# Patient Record
Sex: Female | Born: 1955 | Race: Black or African American | Hispanic: No | Marital: Married | Smoking: Never smoker
Health system: Southern US, Community
[De-identification: ages and names within clinical notes are randomized; demographics above are authoritative.]

## PROBLEM LIST (undated history)

## (undated) DIAGNOSIS — I10 Essential (primary) hypertension: Secondary | ICD-10-CM

## (undated) DIAGNOSIS — G5600 Carpal tunnel syndrome, unspecified upper limb: Secondary | ICD-10-CM

## (undated) DIAGNOSIS — M1990 Unspecified osteoarthritis, unspecified site: Secondary | ICD-10-CM

## (undated) HISTORY — PX: CARPAL TUNNEL RELEASE: SHX101

## (undated) HISTORY — DX: Essential (primary) hypertension: I10

## (undated) HISTORY — DX: Unspecified osteoarthritis, unspecified site: M19.90

## (undated) HISTORY — PX: TOTAL SHOULDER ARTHROPLASTY: SHX126

## (undated) HISTORY — PX: ABDOMINAL HYSTERECTOMY: SHX81

## (undated) HISTORY — DX: Carpal tunnel syndrome, unspecified upper limb: G56.00

## (undated) HISTORY — PX: TRIGGER FINGER RELEASE: SHX641

---

## 2000-02-22 ENCOUNTER — Ambulatory Visit (HOSPITAL_BASED_OUTPATIENT_CLINIC_OR_DEPARTMENT_OTHER): Admission: RE | Admit: 2000-02-22 | Discharge: 2000-02-22 | Payer: Self-pay | Admitting: *Deleted

## 2014-01-10 ENCOUNTER — Other Ambulatory Visit: Payer: Self-pay | Admitting: Orthopedic Surgery

## 2014-01-10 DIAGNOSIS — M545 Low back pain, unspecified: Secondary | ICD-10-CM

## 2014-01-16 ENCOUNTER — Ambulatory Visit
Admission: RE | Admit: 2014-01-16 | Discharge: 2014-01-16 | Disposition: A | Discharge status: Home / Self Care | Payer: BC Managed Care – PPO | Source: Ambulatory Visit | Attending: Orthopedic Surgery | Admitting: Orthopedic Surgery

## 2014-01-16 DIAGNOSIS — M545 Low back pain, unspecified: Secondary | ICD-10-CM

## 2016-09-13 ENCOUNTER — Other Ambulatory Visit: Payer: Self-pay | Admitting: Orthopedic Surgery

## 2016-09-13 DIAGNOSIS — M5416 Radiculopathy, lumbar region: Secondary | ICD-10-CM

## 2016-09-24 ENCOUNTER — Inpatient Hospital Stay
Admission: RE | Admit: 2016-09-24 | Discharge: 2016-09-24 | Disposition: A | Discharge status: Home / Self Care | Payer: BLUE CROSS/BLUE SHIELD | Source: Ambulatory Visit | Attending: Orthopedic Surgery | Admitting: Orthopedic Surgery

## 2016-09-24 ENCOUNTER — Other Ambulatory Visit: Payer: Self-pay

## 2016-10-01 ENCOUNTER — Ambulatory Visit
Admission: RE | Admit: 2016-10-01 | Discharge: 2016-10-01 | Disposition: A | Discharge status: Home / Self Care | Payer: BLUE CROSS/BLUE SHIELD | Source: Ambulatory Visit | Attending: Orthopedic Surgery | Admitting: Orthopedic Surgery

## 2016-10-01 DIAGNOSIS — M5416 Radiculopathy, lumbar region: Secondary | ICD-10-CM

## 2016-10-01 MED ORDER — MEPERIDINE HCL 100 MG/ML IJ SOLN
75.0000 mg | Freq: Once | INTRAMUSCULAR | Status: AC
  Administered 2016-10-01: 75 mg via INTRAMUSCULAR

## 2016-10-01 MED ORDER — ONDANSETRON HCL 4 MG/2ML IJ SOLN
4.0000 mg | Freq: Once | INTRAMUSCULAR | Status: AC
  Administered 2016-10-01: 4 mg via INTRAMUSCULAR

## 2016-10-01 MED ORDER — DIAZEPAM 5 MG PO TABS
10.0000 mg | ORAL_TABLET | Freq: Once | ORAL | Status: AC
  Administered 2016-10-01: 10 mg via ORAL

## 2016-10-01 MED ORDER — IOPAMIDOL (ISOVUE-M 200) INJECTION 41%
15.0000 mL | Freq: Once | INTRAMUSCULAR | Status: AC
  Administered 2016-10-01: 15 mL via INTRATHECAL

## 2016-10-01 NOTE — Discharge Instructions (Signed)

## 2017-01-27 DIAGNOSIS — M19011 Primary osteoarthritis, right shoulder: Secondary | ICD-10-CM | POA: Insufficient documentation

## 2017-01-27 DIAGNOSIS — M5136 Other intervertebral disc degeneration, lumbar region: Secondary | ICD-10-CM | POA: Insufficient documentation

## 2017-01-27 DIAGNOSIS — M19012 Primary osteoarthritis, left shoulder: Secondary | ICD-10-CM

## 2017-01-27 DIAGNOSIS — M2550 Pain in unspecified joint: Secondary | ICD-10-CM | POA: Insufficient documentation

## 2017-01-30 ENCOUNTER — Ambulatory Visit (INDEPENDENT_AMBULATORY_CARE_PROVIDER_SITE_OTHER): Payer: Self-pay

## 2017-01-30 ENCOUNTER — Telehealth: Payer: Self-pay | Admitting: Rheumatology

## 2017-01-30 ENCOUNTER — Ambulatory Visit (INDEPENDENT_AMBULATORY_CARE_PROVIDER_SITE_OTHER): Payer: BLUE CROSS/BLUE SHIELD | Admitting: Rheumatology

## 2017-01-30 ENCOUNTER — Encounter: Payer: Self-pay | Admitting: Rheumatology

## 2017-01-30 VITALS — BP 147/85 | HR 91 | Resp 14 | Ht 66.0 in | Wt 220.0 lb

## 2017-01-30 DIAGNOSIS — M19011 Primary osteoarthritis, right shoulder: Secondary | ICD-10-CM | POA: Diagnosis not present

## 2017-01-30 DIAGNOSIS — M79642 Pain in left hand: Secondary | ICD-10-CM

## 2017-01-30 DIAGNOSIS — Z8719 Personal history of other diseases of the digestive system: Secondary | ICD-10-CM

## 2017-01-30 DIAGNOSIS — M25561 Pain in right knee: Secondary | ICD-10-CM | POA: Diagnosis not present

## 2017-01-30 DIAGNOSIS — Z8639 Personal history of other endocrine, nutritional and metabolic disease: Secondary | ICD-10-CM | POA: Insufficient documentation

## 2017-01-30 DIAGNOSIS — R5383 Other fatigue: Secondary | ICD-10-CM | POA: Diagnosis not present

## 2017-01-30 DIAGNOSIS — G8929 Other chronic pain: Secondary | ICD-10-CM

## 2017-01-30 DIAGNOSIS — M5136 Other intervertebral disc degeneration, lumbar region: Secondary | ICD-10-CM

## 2017-01-30 DIAGNOSIS — M19012 Primary osteoarthritis, left shoulder: Secondary | ICD-10-CM

## 2017-01-30 DIAGNOSIS — M79641 Pain in right hand: Secondary | ICD-10-CM

## 2017-01-30 DIAGNOSIS — M79672 Pain in left foot: Secondary | ICD-10-CM

## 2017-01-30 DIAGNOSIS — M25562 Pain in left knee: Secondary | ICD-10-CM

## 2017-01-30 DIAGNOSIS — E669 Obesity, unspecified: Secondary | ICD-10-CM

## 2017-01-30 DIAGNOSIS — Z8679 Personal history of other diseases of the circulatory system: Secondary | ICD-10-CM

## 2017-01-30 DIAGNOSIS — M79671 Pain in right foot: Secondary | ICD-10-CM | POA: Diagnosis not present

## 2017-01-30 LAB — CBC WITH DIFFERENTIAL/PLATELET
Basophils Absolute: 0 cells/uL (ref 0–200)
Basophils Relative: 0 %
EOS ABS: 101 {cells}/uL (ref 15–500)
Eosinophils Relative: 1 %
HEMATOCRIT: 40.5 % (ref 35.0–45.0)
Hemoglobin: 12.8 g/dL (ref 11.7–15.5)
Lymphocytes Relative: 32 %
Lymphs Abs: 3232 cells/uL (ref 850–3900)
MCH: 27.6 pg (ref 27.0–33.0)
MCHC: 31.6 g/dL — AB (ref 32.0–36.0)
MCV: 87.3 fL (ref 80.0–100.0)
MPV: 9.6 fL (ref 7.5–12.5)
Monocytes Absolute: 808 cells/uL (ref 200–950)
Monocytes Relative: 8 %
NEUTROS PCT: 59 %
Neutro Abs: 5959 cells/uL (ref 1500–7800)
Platelets: 361 10*3/uL (ref 140–400)
RBC: 4.64 MIL/uL (ref 3.80–5.10)
RDW: 14.3 % (ref 11.0–15.0)
WBC: 10.1 10*3/uL (ref 3.8–10.8)

## 2017-01-30 LAB — URIC ACID: Uric Acid, Serum: 4.1 mg/dL (ref 2.5–7.0)

## 2017-01-30 LAB — COMPLETE METABOLIC PANEL WITH GFR
ALT: 11 U/L (ref 6–29)
AST: 13 U/L (ref 10–35)
Albumin: 4.1 g/dL (ref 3.6–5.1)
Alkaline Phosphatase: 51 U/L (ref 33–130)
BUN: 9 mg/dL (ref 7–25)
CALCIUM: 9.7 mg/dL (ref 8.6–10.4)
CHLORIDE: 103 mmol/L (ref 98–110)
CO2: 28 mmol/L (ref 20–32)
Creat: 0.84 mg/dL (ref 0.50–0.99)
GFR, EST AFRICAN AMERICAN: 87 mL/min (ref >=60)
GFR, EST NON AFRICAN AMERICAN: 76 mL/min (ref >=60)
Glucose, Bld: 107 mg/dL — ABNORMAL HIGH (ref 65–99)
POTASSIUM: 4.1 mmol/L (ref 3.5–5.3)
Sodium: 142 mmol/L (ref 135–146)
Total Bilirubin: 0.4 mg/dL (ref 0.2–1.2)
Total Protein: 6.8 g/dL (ref 6.1–8.1)

## 2017-01-30 LAB — CK: CK TOTAL: 190 U/L — AB (ref 29–143)

## 2017-01-30 LAB — TSH: TSH: 1.41 mIU/L

## 2017-01-30 NOTE — Telephone Encounter (Signed)
Patient was seen today and has a question about the xrays that she was given. Please call patient.

## 2017-01-30 NOTE — Telephone Encounter (Signed)
Patient states she was given a paper with results on it when in the office. She wants to make sure that what she was seeing on her AVS was the x-rays she had preformed and the results. Patient advised that is correct.

## 2017-01-30 NOTE — Progress Notes (Signed)
Office Visit Note  Patient: Jamie Pena             Date of Birth: 12/30/1955           MRN: [id number]             PCP: Eliott Nine, MD Referring: Lunette Stands, MD Visit Date: 01/30/2017 Occupation: @GUAROCC @    Subjective:  Pain in multiple joints.   History of Present Illness: Raylan Troiani is a 61 y.o. female seen in consultation per request of Dr. Charlett Blake. According to patient she's had symptoms of joint pain since 2017. She states the pain started in her lower back and gradually moved into other joints. She has had extensive workup for her lumbar spine which showed multilevel disc disease. She has had injections by Dr. Maurice Small. She also has discomfort in her bilateral shoulders for multiple years. She states she had left rotator cuff tear surgery in the past. She's been also told that she is osteoarthritis in about shoulders. She states she has discomfort in her bilateral wrist and her bilateral hands. She also has discomfort in her bilateral knee joints multiple years. She states her ankles and feet swell at times.  She gives history of bilateral carpal tunnel release and also trigger finger release in the past.  Activities of Daily Living:  Patient reports morning stiffness for all day hours.   Patient Reports nocturnal pain.  Difficulty dressing/grooming: Denies Difficulty climbing stairs: Reports Difficulty getting out of chair: Reports Difficulty using hands for taps, buttons, cutlery, and/or writing: Denies   Review of Systems  Constitutional: Positive for fatigue. Negative for night sweats, weight gain, weight loss and weakness.  HENT: Negative for mouth sores, trouble swallowing, trouble swallowing, mouth dryness and nose dryness.   Eyes: Positive for dryness. Negative for pain, redness and visual disturbance.  Respiratory: Negative for cough, shortness of breath and difficulty breathing.   Cardiovascular: Positive for hypertension. Negative for chest pain,  palpitations, irregular heartbeat and swelling in legs/feet.  Gastrointestinal: Negative for blood in stool, constipation and diarrhea.  Endocrine: Negative for increased urination.  Genitourinary: Negative for vaginal dryness.  Musculoskeletal: Positive for arthralgias, joint pain, joint swelling and morning stiffness. Negative for myalgias, muscle weakness, muscle tenderness and myalgias.  Skin: Negative for color change, rash, hair loss, skin tightness, ulcers and sensitivity to sunlight.  Allergic/Immunologic: Negative for susceptible to infections.  Neurological: Negative for dizziness, memory loss and night sweats.  Hematological: Negative for swollen glands.  Psychiatric/Behavioral: Positive for depressed mood and sleep disturbance. The patient is nervous/anxious.     PMFS History:  Patient Active Problem List   Diagnosis Date Noted  . History of hypertension 01/30/2017  . History of hyperlipidemia 01/30/2017  . History of gastroesophageal reflux (GERD) 01/30/2017  . Multiple joint pain 01/27/2017  . Primary osteoarthritis of both shoulders 01/27/2017  . 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 Carpal tunnel syndrome   . Hypertension   . Osteoarthritis     Family History  Problem Relation Age of Onset  . Osteoporosis Mother   . High arches Mother    Past Surgical History:  Procedure Laterality Date  . ABDOMINAL HYSTERECTOMY    . CARPAL TUNNEL RELEASE    . TOTAL SHOULDER ARTHROPLASTY    . TRIGGER FINGER RELEASE     Social History   Social History Narrative  . No narrative on file  Objective: Vital Signs: BP (!) 147/85   Pulse 91   Resp 14   Ht 5\' 6"  (1.676 m)   Wt 220 lb (99.8 kg)   BMI 35.51 kg/m    Physical Exam  Constitutional: She is oriented to person, place, and time. She appears well-developed and well-nourished.  HENT:  Head: Normocephalic and atraumatic.    Eyes: Conjunctivae and EOM are normal.  Neck: Normal range of motion.  Cardiovascular: Normal rate, regular rhythm, normal heart sounds and intact distal pulses.   Pulmonary/Chest: Effort normal and breath sounds normal.  Abdominal: Soft. Bowel sounds are normal.  Lymphadenopathy:    She has no cervical adenopathy.  Neurological: She is alert and oriented to person, place, and time.  Skin: Skin is warm and dry. Capillary refill takes less than 2 seconds.  Psychiatric: She has a normal mood and affect. Her behavior is normal.  Nursing note and vitals reviewed.    Musculoskeletal Exam: C-spine good range of motion. She has painful range of motion of her lumbar spine which was limited. Her shoulder joint abduction was limited to 90 which was painful. Her elbow joints are good range of motion. Wrists joint MCPs PIPs DIPs with good range of motion she has tenderness across her PIPs of her hands. No synovitis was noted. Hip joints knee joints ankles MTPs PIPs with good range of motion. She appears to have some pedal edema in her lower extremities. She painful range of motion of her knee joints ankles and tenderness across MTPs but no synovitis was noted.  CDAI Exam: No CDAI exam completed.    Investigation: No additional findings.   Imaging: Xr Foot 2 Views Left  Result Date: 01/30/2017 PIP/DIP narrowing was noted. No MTP joint narrowing was noted. A  calcaneal spur was noted. Impression: Findings are consistent with osteoarthritis of the foot.  Xr Foot 2 Views Right  Result Date: 01/30/2017 PIP/DIP narrowing was noted. No MTP joint narrowing was noted. A  calcaneal spur was noted. Impression: Findings are consistent with osteoarthritis of the foot.  Xr Hand 2 View Left  Result Date: 01/30/2017 PIP/DIP narrowing was noted. No MCP joint narrowing was noted. No intercarpal joint space narrowing was noted. These findings are consistent with osteoarthritis of the hand  Xr Hand 2 View  Right  Result Date: 01/30/2017 PIP/DIP narrowing was noted. No MCP joint narrowing was noted. No intercarpal joint space narrowing was noted. These findings are consistent with osteoarthritis of the hand.  Xr Knee 3 View Left  Result Date: 01/30/2017 Moderate medial compartment narrowing and medial and intercondylar osteophytes were noted. No chondrocalcinosis were noted. Severe patellofemoral narrowing was noted. 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  Xr Knee 3 View Right  Result Date: 01/30/2017 Moderate medial compartment narrowing and medial and intercondylar osteophytes were noted. No chondrocalcinosis were noted. Severe patellofemoral narrowing was noted. 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  Xr Shoulder Left  Result Date: 01/30/2017 Glenohumeral joint space narrowing and spurring was noted. Acromioclavicular joint space narrowing was noted. Impression: These findings are consistent with osteoarthritis of the shoulder.  Xr Shoulder Right  Result Date: 01/30/2017 No glenohumeral joint space narrowing was noted. No acromioclavicular joint space narrowing was noted.   Speciality Comments: No specialty comments available.    Procedures:  No procedures performed Allergies: Sulfa antibiotics   Assessment / Plan:     Visit  Diagnoses: Pain in both hands -patient gives history of pain in her bilateral hands with intermittent swelling. I do not see any synovitis on examination today. I'll obtain following labs to evaluate this further. Plan: XR Hand 2 View Right, XR Hand 2 View Left, mild osteoarthritic changes were noted in bilateral hands. Sedimentation rate, ANA, Rheumatoid factor, Cyclic citrul peptide antibody, IgG, Angiotensin converting enzyme, Uric acid  Bilateral carpal tunnel release and bilateral trigger thumb  release according to patient.  Primary osteoarthritis of both shoulders. She gives history of chronic pain in her bilateral shoulders. She had left rotator cuff tear repair in the past. She has limited range of motion of bilateral shoulders. - Plan: XR Shoulder Right, XR Shoulder Left. Right shoulder joint x-ray was unremarkable. Left shoulder joint which revealed osteoarthritis.  Chronic pain of both knees. No warmth swelling or effusion was noted. - Plan: XR KNEE 3 VIEW RIGHT, XR KNEE 3 VIEW LEFT. Bilateral knee joints showed moderate osteoarthritis and severe chondromalacia patella no chondrocalcinosis.  Pain in both feet. She gives history of pain in bilateral ankles and feet with intermittent swelling her do not see any swelling or synovitis on examination today. She has some pedal edema. - Plan: XR Foot 2 Views Right, XR Foot 2 Views Left. Mild osteoarthritic changes were noted in bilateral feet.  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 Charlett BlakeVoytek and also has had injections in her lower back. She continues to have lower back pain.  Other fatigue - Plan: CBC with Differential/Platelet, COMPLETE METABOLIC PANEL WITH GFR, CK, TSH  History of hypertension: Her blood pressure is elevated at advised her to monitor blood pressure closely.  History of hyperlipidemia  History of gastroesophageal reflux (GERD)   Obesity (BMI 35.51) weight loss will be helpful for lower back pain and also knee joint discomfort.  Orders: Orders Placed This Encounter  Procedures  . XR Hand 2 View Right  . XR Hand 2 View Left  . XR Shoulder Right  . XR Shoulder Left  . XR KNEE 3 VIEW RIGHT  . XR KNEE 3 VIEW LEFT  . XR Foot 2 Views Right  . XR Foot 2 Views Left  . CBC with Differential/Platelet  . COMPLETE METABOLIC PANEL WITH GFR  . Sedimentation rate  . CK  . TSH  . ANA  . Rheumatoid factor  . Cyclic citrul peptide antibody, IgG  .  Angiotensin converting enzyme  . Uric acid   No orders of the defined types were placed in this encounter.   Face-to-face time spent with patient was 50 minutes. 50% of time was spent in counseling and coordination of care.  Follow-Up Instructions: Return for Osteoarthritis.   Pollyann SavoyShaili Quantel Mcinturff, MD  Note - This record has been created using [employer] software.  Chart creation errors have been sought, but may not always  have been located. Such creation errors do not reflect on  the standard of medical care.

## 2017-01-31 LAB — CYCLIC CITRUL PEPTIDE ANTIBODY, IGG: Cyclic Citrullin Peptide Ab: <16 Units

## 2017-01-31 LAB — SEDIMENTATION RATE: SED RATE: 1 mm/h (ref 0–30)

## 2017-01-31 LAB — RHEUMATOID FACTOR

## 2017-01-31 LAB — ANA: ANA: NEGATIVE

## 2017-01-31 LAB — ANGIOTENSIN CONVERTING ENZYME: Angiotensin-Converting Enzyme: 28 U/L (ref 9–67)

## 2017-02-01 NOTE — Progress Notes (Signed)
WNLs except CK mildly elevated. Will discuss at fu visit. May sch US of bilat hands if she is still having hand pain and intermittent swelling.

## 2017-03-03 DIAGNOSIS — M19042 Primary osteoarthritis, left hand: Principal | ICD-10-CM

## 2017-03-03 DIAGNOSIS — M170 Bilateral primary osteoarthritis of knee: Secondary | ICD-10-CM | POA: Insufficient documentation

## 2017-03-03 DIAGNOSIS — M19012 Primary osteoarthritis, left shoulder: Secondary | ICD-10-CM | POA: Insufficient documentation

## 2017-03-03 DIAGNOSIS — M19071 Primary osteoarthritis, right ankle and foot: Secondary | ICD-10-CM | POA: Insufficient documentation

## 2017-03-03 DIAGNOSIS — Z9889 Other specified postprocedural states: Secondary | ICD-10-CM | POA: Insufficient documentation

## 2017-03-03 DIAGNOSIS — M19072 Primary osteoarthritis, left ankle and foot: Secondary | ICD-10-CM

## 2017-03-03 DIAGNOSIS — M19041 Primary osteoarthritis, right hand: Secondary | ICD-10-CM | POA: Insufficient documentation

## 2017-03-03 NOTE — Progress Notes (Signed)
Office Visit Note  Patient: Jamie Pena             Date of Birth: 09/04/1955           MRN: [id number]             PCP: Jamie Calamity, MD Referring: Jamie Pena,* Visit Date: 03/05/2017 Occupation: _0 @    Subjective:  Pain in multiple joints   History of Present Illness: Jamie Pena is a 61 y.o. female with history of osteoarthritis. She continues to have pain and discomfort in her both hands left shoulder, bilateral knees and feet. She states she's been having increased pain in her lower back and also in her upper thoracic region. She denies any joint swelling.  Activities of Daily Living:  Patient reports morning stiffness for all day hours.   Patient Reports nocturnal pain.  Difficulty dressing/grooming: Denies Difficulty climbing stairs: Reports Difficulty getting out of chair: Reports Difficulty using hands for taps, buttons, cutlery, and/or writing: Reports   Review of Systems  Constitutional: Positive for fatigue. Negative for night sweats, weight gain, weight loss and weakness.  HENT: Negative for mouth sores, trouble swallowing, trouble swallowing, mouth dryness and nose dryness.   Eyes: Positive for dryness. Negative for pain, redness and visual disturbance.  Respiratory: Negative for cough, shortness of breath and difficulty breathing.   Cardiovascular: Negative for chest pain, palpitations, hypertension, irregular heartbeat and swelling in legs/feet.  Gastrointestinal: Negative for blood in stool, constipation and diarrhea.  Endocrine: Negative for increased urination.  Genitourinary: Negative for vaginal dryness.  Musculoskeletal: Positive for arthralgias, joint pain and morning stiffness. Negative for joint swelling, myalgias, muscle weakness, muscle tenderness and myalgias.  Skin: Negative for color change, rash, hair loss, skin tightness, ulcers and sensitivity to sunlight.  Allergic/Immunologic: Negative for susceptible to  infections.  Neurological: Negative for dizziness, memory loss and night sweats.  Hematological: Negative for swollen glands.  Psychiatric/Behavioral: Positive for depressed mood. Negative for sleep disturbance. The patient is nervous/anxious.     PMFS History:  Patient Active Problem List   Diagnosis Date Noted  . Primary osteoarthritis of both hands 03/03/2017  . Primary osteoarthritis, left shoulder 03/03/2017  . Primary osteoarthritis of both knees 03/03/2017  . Primary osteoarthritis of both feet 03/03/2017  . Status post carpal tunnel release of both wrists 03/03/2017  . History of hypertension 01/30/2017  . History of hyperlipidemia 01/30/2017  . History of gastroesophageal reflux (GERD) 01/30/2017  . Multiple joint pain 01/27/2017  . Primary osteoarthritis of both shoulders 01/27/2017  . 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 Carpal tunnel syndrome   . Hypertension   . Osteoarthritis     Family History  Problem Relation Age of Onset  . Osteoporosis Mother   . High arches Mother    Past Surgical History:  Procedure Laterality Date  . ABDOMINAL HYSTERECTOMY    . CARPAL TUNNEL RELEASE    . TOTAL SHOULDER ARTHROPLASTY    . TRIGGER FINGER RELEASE     Social History   Social History Narrative  . No narrative on file     Objective: Vital Signs: BP 128/80   Pulse 88   Resp 16   Ht _1  (1.676 m)   Wt 218 lb (98.9 kg)   BMI 35.19 kg/m    Physical Exam  Constitutional: She is oriented to person, place, and time. She appears well-developed and well-nourished.  HENT:  Head: Normocephalic and atraumatic.  Eyes: Conjunctivae and EOM are normal.  Neck: Normal range of motion.  Cardiovascular: Normal rate, regular rhythm, normal heart sounds and intact distal pulses.   Pulmonary/Chest: Effort normal and breath sounds normal.  Abdominal: Soft. Bowel sounds are normal.    Lymphadenopathy:    She has no cervical adenopathy.  Neurological: She is alert and oriented to person, place, and time.  Skin: Skin is warm and dry. Capillary refill takes less than 2 seconds.  Psychiatric: She has a normal mood and affect. Her behavior is normal.  Nursing note and vitals reviewed.    Musculoskeletal Exam: C-spine and thoracic lumbar spine some limitation with range of motion and discomfort. She had tenderness over right supraspinatus muscle. Elbow joints wrist joints MCPs with good range of motion. She has some prominence of DIP joints. No synovitis was noted. Hip joints knee joints ankles MTPs PIPs with good range of motion. She has discomfort range of motion of her knee joints and crepitus. No synovitis or swelling was noted.  CDAI Exam: No CDAI exam completed.    Investigation: No additional findings. 01/30/2017 CBC, CMP normal, ESR 1, CK190(high), TSH normal, ANA-,RF-, anti-CCP-,ACe normal, uric acid 4.8  Imaging: No results found.  Speciality Comments: No specialty comments available.    Procedures:  No procedures performed Allergies: Sulfa antibiotics   Assessment / Plan:     Visit Diagnoses: Primary osteoarthritis of both hands - mild. She does not have any synovitis on examination. Joint protection and muscle strengthening discussed.  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 Weight loss diet and exercise was discussed. A handout on knee exercises was also given.  Primary osteoarthritis of both feet - mild. Proper fitting shoes were discussed.  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 She has chronic lower back pain. She may benefit from weight loss.  Status post carpal tunnel release of both wrists: Doing well  History of depression: She was quite tearful during the conversation today and feels depressed. I've advised her  to make Follow-up appointment with her PCP. She may benefit from use of Cymbalta which may help with her musculoskeletal pain and depression.  History of hypertension: Her blood pressure seems to be controlled.  History of hyperlipidemia  History of gastroesophageal reflux (GERD)  Obesity (BMI 35.0-39.9 without comorbidity) : We had detailed discussion regarding weight loss diet and exercise today.   Orders: No orders of the defined types were placed in this encounter.  No orders of the defined types were placed in this encounter.     Follow-Up Instructions: Return if symptoms worsen or fail to improve, for Osteoarthritis.   Bo Merino, MD  Note - This record has been created using Editor, commissioning.  Chart creation errors have been sought, but may not always  have been located. Such creation errors do not reflect on  the standard of medical care.

## 2017-03-05 ENCOUNTER — Ambulatory Visit (INDEPENDENT_AMBULATORY_CARE_PROVIDER_SITE_OTHER): Payer: BLUE CROSS/BLUE SHIELD | Admitting: Rheumatology

## 2017-03-05 ENCOUNTER — Encounter: Payer: Self-pay | Admitting: Rheumatology

## 2017-03-05 VITALS — BP 128/80 | HR 88 | Resp 16 | Ht 66.0 in | Wt 218.0 lb

## 2017-03-05 DIAGNOSIS — Z8659 Personal history of other mental and behavioral disorders: Secondary | ICD-10-CM | POA: Diagnosis not present

## 2017-03-05 DIAGNOSIS — M170 Bilateral primary osteoarthritis of knee: Secondary | ICD-10-CM | POA: Diagnosis not present

## 2017-03-05 DIAGNOSIS — M19012 Primary osteoarthritis, left shoulder: Secondary | ICD-10-CM

## 2017-03-05 DIAGNOSIS — M19071 Primary osteoarthritis, right ankle and foot: Secondary | ICD-10-CM

## 2017-03-05 DIAGNOSIS — M19042 Primary osteoarthritis, left hand: Secondary | ICD-10-CM | POA: Diagnosis not present

## 2017-03-05 DIAGNOSIS — Z8639 Personal history of other endocrine, nutritional and metabolic disease: Secondary | ICD-10-CM

## 2017-03-05 DIAGNOSIS — M5136 Other intervertebral disc degeneration, lumbar region: Secondary | ICD-10-CM | POA: Diagnosis not present

## 2017-03-05 DIAGNOSIS — M19041 Primary osteoarthritis, right hand: Secondary | ICD-10-CM

## 2017-03-05 DIAGNOSIS — Z8719 Personal history of other diseases of the digestive system: Secondary | ICD-10-CM

## 2017-03-05 DIAGNOSIS — M19072 Primary osteoarthritis, left ankle and foot: Secondary | ICD-10-CM

## 2017-03-05 DIAGNOSIS — E669 Obesity, unspecified: Secondary | ICD-10-CM

## 2017-03-05 DIAGNOSIS — Z9889 Other specified postprocedural states: Secondary | ICD-10-CM

## 2017-03-05 DIAGNOSIS — Z8679 Personal history of other diseases of the circulatory system: Secondary | ICD-10-CM

## 2017-03-05 NOTE — Patient Instructions (Signed)
Natural anti-inflammatories  You can purchase these at [employer], [employer] or online.  . Turmeric (capsules)  . Ginger (ginger root or capsules)  . Omega 3 (Fish, flax seeds, chia seeds, walnuts, almonds)  . Tart cherry (dried or extract)   Patient should be under the care of a physician while taking these supplements. This may not be reproduced without the permission of Dr. Yeray Tomas.  Knee Exercises Ask your health care provider which exercises are safe for you. Do exercises exactly as told by your health care provider and adjust them as directed. It is normal to feel mild stretching, pulling, tightness, or discomfort as you do these exercises, but you should stop right away if you feel sudden pain or your pain gets worse.Do not begin these exercises until told by your health care provider. STRETCHING AND RANGE OF MOTION EXERCISES These exercises warm up your muscles and joints and improve the movement and flexibility of your knee. These exercises also help to relieve pain, numbness, and tingling. Exercise A: Knee Extension, Prone 1. Lie on your abdomen on a bed. 2. Place your left / right knee just beyond the edge of the surface so your knee is not on the bed. You can put a towel under your left / right thigh just above your knee for comfort. 3. Relax your leg muscles and allow gravity to straighten your knee. You should feel a stretch behind your left / right knee. 4. Hold this position for __________ seconds. 5. Scoot up so your knee is supported between repetitions. Repeat __________ times. Complete this stretch __________ times a day. Exercise B: Knee Flexion, Active  1. Lie on your back with both knees straight. If this causes back discomfort, bend your left / right knee so your foot is flat on the floor. 2. Slowly slide your left / right heel back toward your buttocks until you feel a gentle stretch in the front of your knee or thigh. 3. Hold this position for  __________ seconds. 4. Slowly slide your left / right heel back to the starting position. Repeat __________ times. Complete this exercise __________ times a day. Exercise C: Quadriceps, Prone  1. Lie on your abdomen on a firm surface, such as a bed or padded floor. 2. Bend your left / right knee and hold your ankle. If you cannot reach your ankle or pant leg, loop a belt around your foot and grab the belt instead. 3. Gently pull your heel toward your buttocks. Your knee should not slide out to the side. You should feel a stretch in the front of your thigh and knee. 4. Hold this position for __________ seconds. Repeat __________ times. Complete this stretch __________ times a day. Exercise D: Hamstring, Supine 1. Lie on your back. 2. Loop a belt or towel over the ball of your left / right foot. The ball of your foot is on the walking surface, right under your toes. 3. Straighten your left / right knee and slowly pull on the belt to raise your leg until you feel a gentle stretch behind your knee. ? Do not let your left / right knee bend while you do this. ? Keep your other leg flat on the floor. 4. Hold this position for __________ seconds. Repeat __________ times. Complete this stretch __________ times a day. STRENGTHENING EXERCISES These exercises build strength and endurance in your knee. Endurance is the ability to use your muscles for a long time, even after they get tired. Exercise E:   Quadriceps, Isometric  1. Lie on your back with your left / right leg extended and your other knee bent. Put a rolled towel or small pillow under your knee if told by your health care provider. 2. Slowly tense the muscles in the front of your left / right thigh. You should see your kneecap slide up toward your hip or see increased dimpling just above the knee. This motion will push the back of the knee toward the floor. 3. For __________ seconds, keep the muscle as tight as you can without increasing your  pain. 4. Relax the muscles slowly and completely. Repeat __________ times. Complete this exercise __________ times a day. Exercise F: Straight Leg Raises - Quadriceps 1. Lie on your back with your left / right leg extended and your other knee bent. 2. Tense the muscles in the front of your left / right thigh. You should see your kneecap slide up or see increased dimpling just above the knee. Your thigh may even shake a bit. 3. Keep these muscles tight as you raise your leg 4-6 inches (10-15 cm) off the floor. Do not let your knee bend. 4. Hold this position for __________ seconds. 5. Keep these muscles tense as you lower your leg. 6. Relax your muscles slowly and completely after each repetition. Repeat __________ times. Complete this exercise __________ times a day. Exercise G: Hamstring, Isometric 1. Lie on your back on a firm surface. 2. Bend your left / right knee approximately __________ degrees. 3. Dig your left / right heel into the surface as if you are trying to pull it toward your buttocks. Tighten the muscles in the back of your thighs to dig as hard as you can without increasing any pain. 4. Hold this position for __________ seconds. 5. Release the tension gradually and allow your muscles to relax completely for __________ seconds after each repetition. Repeat __________ times. Complete this exercise __________ times a day. Exercise H: Hamstring Curls  If told by your health care provider, do this exercise while wearing ankle weights. Begin with __________ weights. Then increase the weight by 1 lb (0.5 kg) increments. Do not wear ankle weights that are more than __________. 1. Lie on your abdomen with your legs straight. 2. Bend your left / right knee as far as you can without feeling pain. Keep your hips flat against the floor. 3. Hold this position for __________ seconds. 4. Slowly lower your leg to the starting position.  Repeat __________ times. Complete this exercise  __________ times a day. Exercise I: Squats (Quadriceps) 1. Stand in front of a table, with your feet and knees pointing straight ahead. You may rest your hands on the table for balance but not for support. 2. Slowly bend your knees and lower your hips like you are going to sit in a chair. ? Keep your weight over your heels, not over your toes. ? Keep your lower legs upright so they are parallel with the table legs. ? Do not let your hips go lower than your knees. ? Do not bend lower than told by your health care provider. ? If your knee pain increases, do not bend as low. 3. Hold the squat position for __________ seconds. 4. Slowly push with your legs to return to standing. Do not use your hands to pull yourself to standing. Repeat __________ times. Complete this exercise __________ times a day. Exercise J: Wall Slides (Quadriceps)  1. Lean your back against a smooth wall or door while   you walk your feet out 18-24 inches (46-61 cm) from it. 2. Place your feet hip-width apart. 3. Slowly slide down the wall or door until your knees bend __________ degrees. Keep your knees over your heels, not over your toes. Keep your knees in line with your hips. 4. Hold for __________ seconds. Repeat __________ times. Complete this exercise __________ times a day. Exercise K: Straight Leg Raises - Hip Abductors 1. Lie on your side with your left / right leg in the top position. Lie so your head, shoulder, knee, and hip line up. You may bend your bottom knee to help you keep your balance. 2. Roll your hips slightly forward so your hips are stacked directly over each other and your left / right knee is facing forward. 3. Leading with your heel, lift your top leg 4-6 inches (10-15 cm). You should feel the muscles in your outer hip lifting. ? Do not let your foot drift forward. ? Do not let your knee roll toward the ceiling. 4. Hold this position for __________ seconds. 5. Slowly return your leg to the starting  position. 6. Let your muscles relax completely after each repetition. Repeat __________ times. Complete this exercise __________ times a day. Exercise L: Straight Leg Raises - Hip Extensors 1. Lie on your abdomen on a firm surface. You can put a pillow under your hips if that is more comfortable. 2. Tense the muscles in your buttocks and lift your left / right leg about 4-6 inches (10-15 cm). Keep your knee straight as you lift your leg. 3. Hold this position for __________ seconds. 4. Slowly lower your leg to the starting position. 5. Let your leg relax completely after each repetition. Repeat __________ times. Complete this exercise __________ times a day. This information is not intended to replace advice given to you by your health care provider. Make sure you discuss any questions you have with your health care provider. Document Released: 04/24/2005 Document Revised: 03/04/2016 Document Reviewed: 04/16/2015 [employer] Interactive Patient Education  2018 [employer]  Back Exercises The following exercises strengthen the muscles that help to support the back. They also help to keep the lower back flexible. Doing these exercises can help to prevent back pain or lessen existing pain. If you have back pain or discomfort, try doing these exercises 2-3 times each day or as told by your health care provider. When the pain goes away, do them once each day, but increase the number of times that you repeat the steps for each exercise (do more repetitions). If you do not have back pain or discomfort, do these exercises once each day or as told by your health care provider. Exercises Single Knee to Chest  Repeat these steps 3-5 times for each leg: 6. Lie on your back on a firm bed or the floor with your legs extended. 7. Bring one knee to your chest. Your other leg should stay extended and in contact with the floor. 8. Hold your knee in place by grabbing your knee or thigh. 9. Pull on your knee  until you feel a gentle stretch in your lower back. 10. Hold the stretch for 10-30 seconds. 11. Slowly release and straighten your leg.  Pelvic Tilt  Repeat these steps 5-10 times: 5. Lie on your back on a firm bed or the floor with your legs extended. 6. Bend your knees so they are pointing toward the ceiling and your feet are flat on the floor. 7. Tighten your lower abdominal muscles   to press your lower back against the floor. This motion will tilt your pelvis so your tailbone points up toward the ceiling instead of pointing to your feet or the floor. 8. With gentle tension and even breathing, hold this position for 5-10 seconds.  Cat-Cow  Repeat these steps until your lower back becomes more flexible: 5. Get into a hands-and-knees position on a firm surface. Keep your hands under your shoulders, and keep your knees under your hips. You may place padding under your knees for comfort. 6. Let your head hang down, and point your tailbone toward the floor so your lower back becomes rounded like the back of a cat. 7. Hold this position for 5 seconds. 8. Slowly lift your head and point your tailbone up toward the ceiling so your back forms a sagging arch like the back of a cow. 9. Hold this position for 5 seconds.  Press-Ups  Repeat these steps 5-10 times: 1. Lie on your abdomen (face-down) on the floor. 2. Place your palms near your head, about shoulder-width apart. 3. While you keep your back as relaxed as possible and keep your hips on the floor, slowly straighten your arms to raise the top half of your body and lift your shoulders. Do not use your back muscles to raise your upper torso. You may adjust the placement of your hands to make yourself more comfortable. 4. Hold this position for 5 seconds while you keep your back relaxed. 5. Slowly return to lying flat on the floor.  Bridges  Repeat these steps 10 times: 5. Lie on your back on a firm surface. 6. Bend your knees so they are  pointing toward the ceiling and your feet are flat on the floor. 7. Tighten your buttocks muscles and lift your buttocks off of the floor until your waist is at almost the same height as your knees. You should feel the muscles working in your buttocks and the back of your thighs. If you do not feel these muscles, slide your feet 1-2 inches farther away from your buttocks. 8. Hold this position for 3-5 seconds. 9. Slowly lower your hips to the starting position, and allow your buttocks muscles to relax completely.  If this exercise is too easy, try doing it with your arms crossed over your chest. Abdominal Crunches  Repeat these steps 5-10 times: 7. Lie on your back on a firm bed or the floor with your legs extended. 8. Bend your knees so they are pointing toward the ceiling and your feet are flat on the floor. 9. Cross your arms over your chest. 10. Tip your chin slightly toward your chest without bending your neck. 11. Tighten your abdominal muscles and slowly raise your trunk (torso) high enough to lift your shoulder blades a tiny bit off of the floor. Avoid raising your torso higher than that, because it can put too much stress on your low back and it does not help to strengthen your abdominal muscles. 12. Slowly return to your starting position.  Back Lifts Repeat these steps 5-10 times: 6. Lie on your abdomen (face-down) with your arms at your sides, and rest your forehead on the floor. 7. Tighten the muscles in your legs and your buttocks. 8. Slowly lift your chest off of the floor while you keep your hips pressed to the floor. Keep the back of your head in line with the curve in your back. Your eyes should be looking at the floor. 9. Hold this position for 3-5   seconds. 10. Slowly return to your starting position.  Contact a health care provider if:  Your back pain or discomfort gets much worse when you do an exercise.  Your back pain or discomfort does not lessen within 2 hours  after you exercise. If you have any of these problems, stop doing these exercises right away. Do not do them again unless your health care provider says that you can. Get help right away if:  You develop sudden, severe back pain. If this happens, stop doing the exercises right away. Do not do them again unless your health care provider says that you can. This information is not intended to replace advice given to you by your health care provider. Make sure you discuss any questions you have with your health care provider. Document Released: 07/18/2004 Document Revised: 10/18/2015 Document Reviewed: 08/04/2014 Else[employer] Interactive Patient Education  2017 [employer]  

## 2017-06-11 ENCOUNTER — Other Ambulatory Visit: Payer: BLUE CROSS/BLUE SHIELD | Admitting: Rheumatology

## 2018-12-23 IMAGING — CT CT L SPINE W/ CM
1 of 5 series · 5 of 14 positions shown, 7 images · non-contrast
Comparison: MRI of the lumbar spine 07/20/2016

CLINICAL DATA: Low back and left leg pain extending to the great
toe.
TECHNIQUE: Contiguous axial images were obtained through the Lumbar spine after
the intrathecal infusion of infusion. Coronal and sagittal
reconstructions were obtained of the axial image sets.

[Series 3: l spine soft · axial · 0.27mm/px · z∈[-199,-43]mm · 5 of 78 slices shown, 7 images]
[im 13/78  soft-tissue]
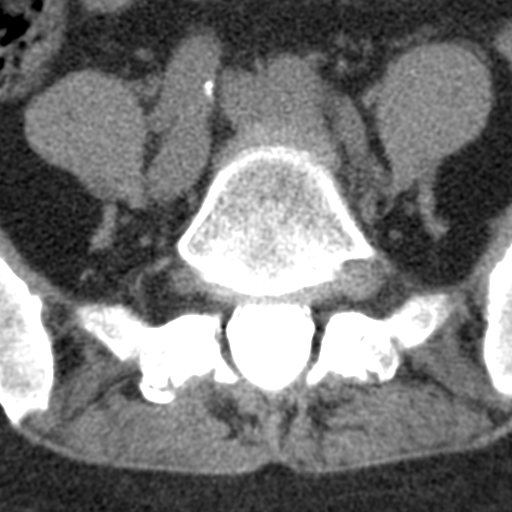
[im 13/78  bone]
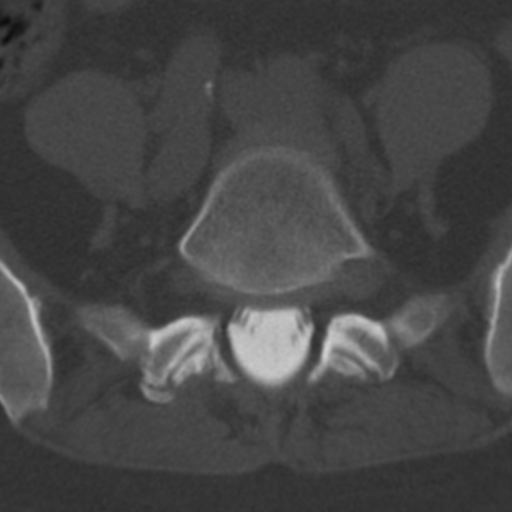
[im 26/78  bone]
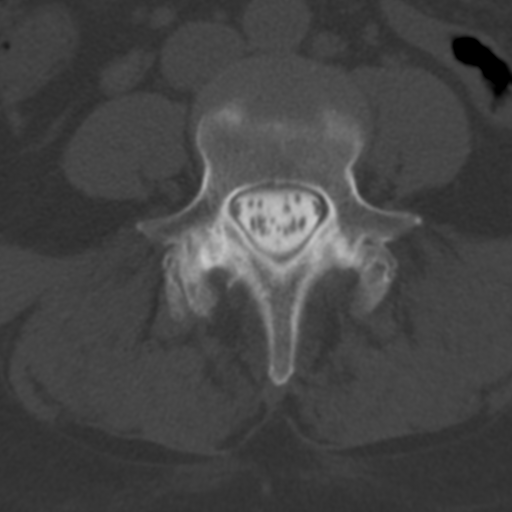
[im 39/78  bone]
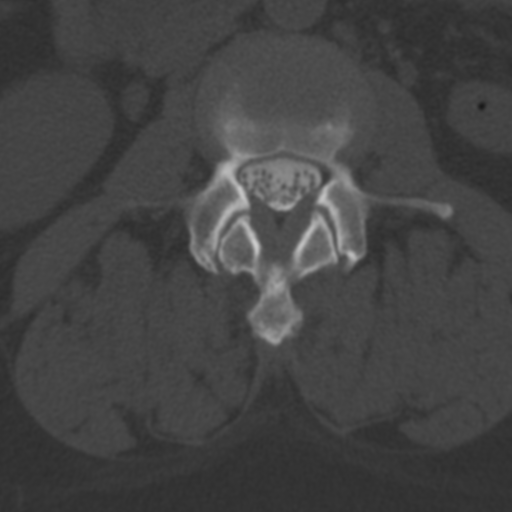
[im 52/78  bone]
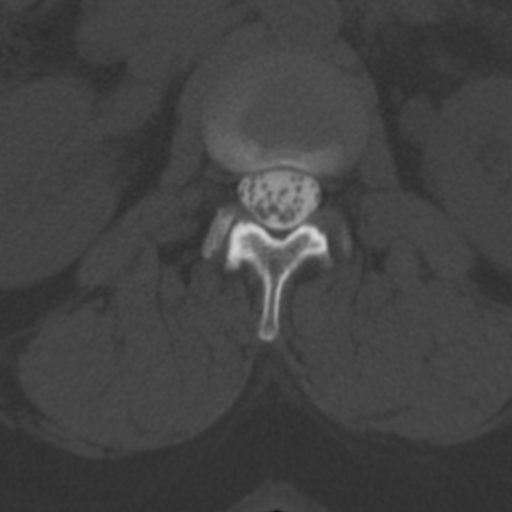
[im 65/78  soft-tissue]
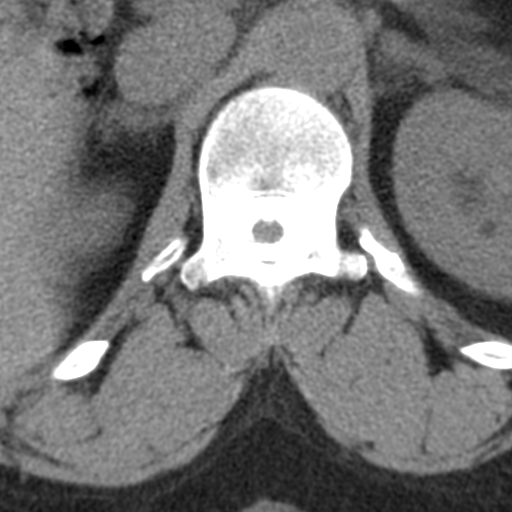
[im 65/78  bone]
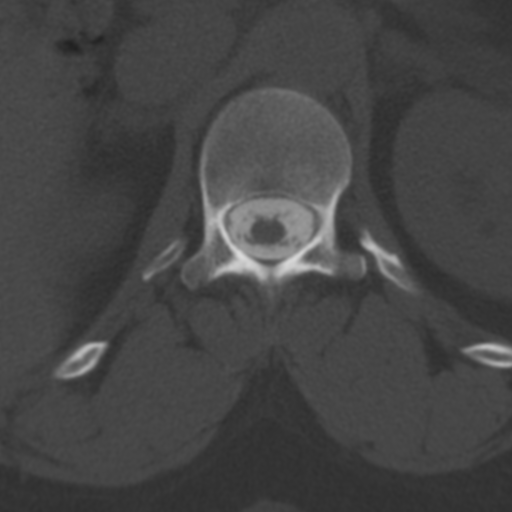

[5 of 14 positions shown; findings below may reference images not displayed]

EXAM:
LUMBAR MYELOGRAM

FLUOROSCOPY TIME:  Radiation Exposure Index (as provided by the
fluoroscopic device): 236.73 uGy*m2

Fluoroscopy Time:  15 seconds

Number of Acquired Images:  15

PROCEDURE:
After thorough discussion of risks and benefits of the procedure
including bleeding, infection, injury to nerves, blood vessels,
adjacent structures as well as headache and CSF leak, written and
oral informed consent was obtained. Consent was obtained by Dr.
Quasie Vybez. Time out form was completed.

Patient was positioned prone on the fluoroscopy table. Local
anesthesia was provided with 1% lidocaine without epinephrine after
prepped and draped in the usual sterile fashion. Puncture was
performed at L2-3 using a 3 1/2 inch 22-gauge spinal needle via
right paramedian approach. Using a single pass through the dura, the
needle was placed within the thecal sac, with return of clear CSF.
15 mL of Isovue-M 200 was injected into the thecal sac, with normal
opacification of the nerve roots and cauda equina consistent with
free flow within the subarachnoid space.

I personally performed the lumbar puncture and administered the
intrathecal contrast. I also personally supervised acquisition of
the myelogram images.
FINDINGS: LUMBAR MYELOGRAM FINDINGS:

5 lumbar type vertebral bodies are present. Slight anterolisthesis
is present at L4-5. There is uncovering of the disc. There is early
truncation of the traversing L5 nerve roots bilaterally, left
greater than right. Mild subarticular narrowing is present on the
left at L5-S1 secondary to a broad-based disc protrusion.

Minimal retrolisthesis is present at L3-4 without significant
stenosis.

Anterolisthesis at L4-5 increases slightly with standing and is
worse with flexion. The disc protrusion L5-S1 does not change
significantly with standing, flexion, or extension.

CT LUMBAR MYELOGRAM FINDINGS:

Five non rib-bearing lumbar type vertebral bodies are present. The
lumbar spine is imaged from the midbody of T11 through S2-3. Limited
imaging of the abdomen demonstrates minimal atherosclerotic
calcifications in the proximal iliac arteries. No solid organ
lesions are present. There is no significant adenopathy.

T12-L1: The a shallow disc protrusion is present. There is no
significant stenosis.

L1-2:  No significant disc protrusion or stenosis is present.

L2-3: Mild disc bulging is present. A moderate bilateral facet
hypertrophy and ligamentum flavum thickening is noted. There is no
significant stenosis.

L3-4: A rightward disc bulge is present. Moderate facet hypertrophy
and ligamentum flavum thickening is noted bilaterally. There is no
focal stenosis.

L4-5: There is uncovering of a broad-based disc protrusion. Moderate
facet hypertrophy and spurring is noted bilaterally. This results in
mild subarticular and foraminal narrowing bilaterally, right greater
than left.

L5-S1: The a broad-based disc protrusion is present. Moderate facet
hypertrophy is worse right than left. This results in mild
subarticular and foraminal narrowing bilaterally, slightly worse on
the left.
IMPRESSION: 1. Grade 1 anterolisthesis at L4-5 is exaggerated by standing and
flexion. In the supine position there is mild subarticular and
foraminal narrowing bilaterally, right greater than left. This is
not significantly changed from the MRI.
2. Broad-based disc protrusion and moderate facet hypertrophy at
L5-S1 results an mild subarticular and foraminal narrowing
bilaterally, left greater than right.
3. Disc bulge and facet hypertrophy at L2-3 and L3-4 without
significant stenosis.

## 2018-12-23 IMAGING — XA DG MYELOGRAPHY LUMBAR INJ LUMBOSACRAL
13 of 16 series · 13 of 16 positions shown · non-contrast
Comparison: MRI of the lumbar spine 07/20/2016

CLINICAL DATA: Low back and left leg pain extending to the great
toe.
TECHNIQUE: Contiguous axial images were obtained through the Lumbar spine after
the intrathecal infusion of infusion. Coronal and sagittal
reconstructions were obtained of the axial image sets.

[Series 1: w lumbar spine lat · 0.15mm/px · 1 of 1 slices shown]
[im 1/1]
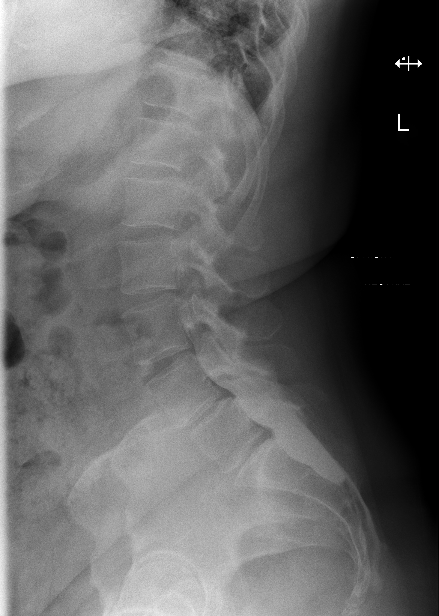

[Series 1: ortho standard · 1 of 1 slices shown (1 of 11)]
[im 1/1]
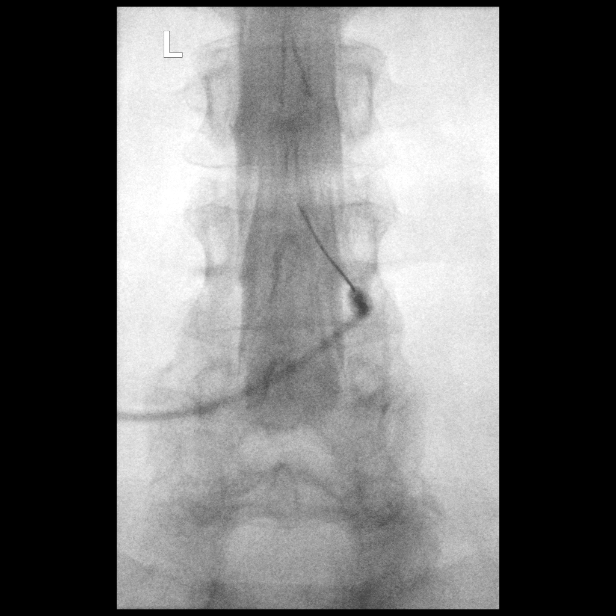

[Series 2: ortho standard · 1 of 1 slices shown (2 of 11)]
[im 1/1]
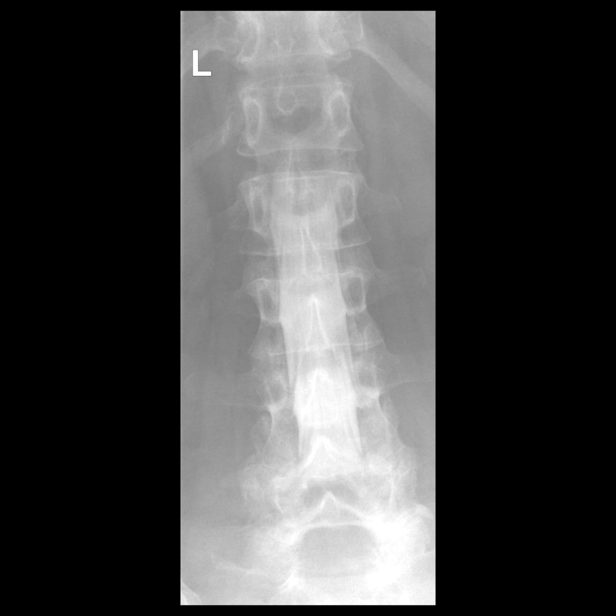

[Series 3: ortho standard · 1 of 1 slices shown (3 of 11)]
[im 1/1]
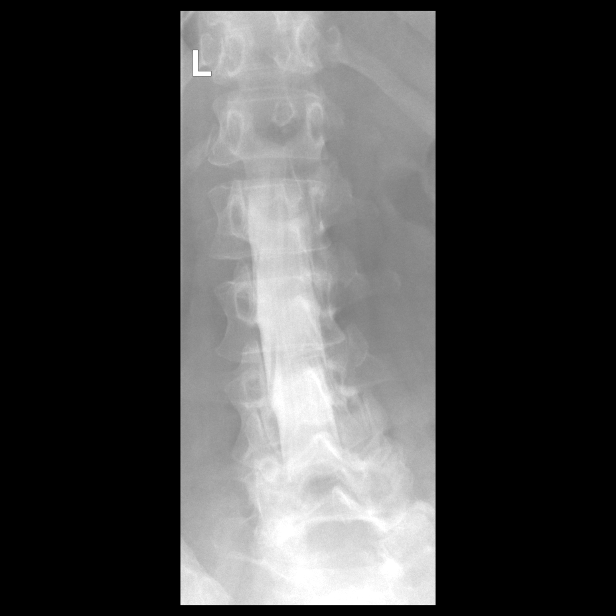

[Series 3: w lumbar spine extension · 0.15mm/px · 1 of 1 slices shown]
[im 1/1]
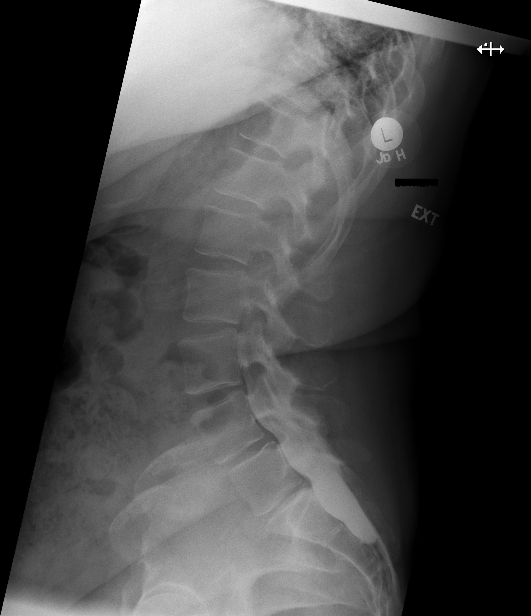

[Series 4: ortho standard · 1 of 1 slices shown (4 of 11)]
[im 1/1]
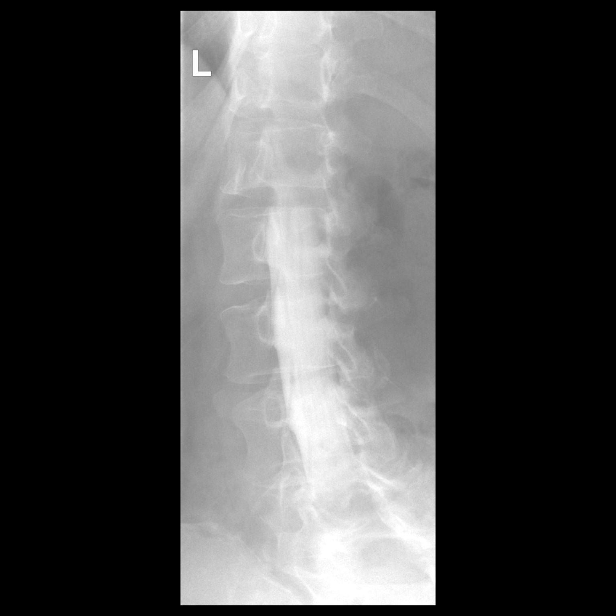

[Series 6: ortho standard · 1 of 1 slices shown (5 of 11)]
[im 1/1]
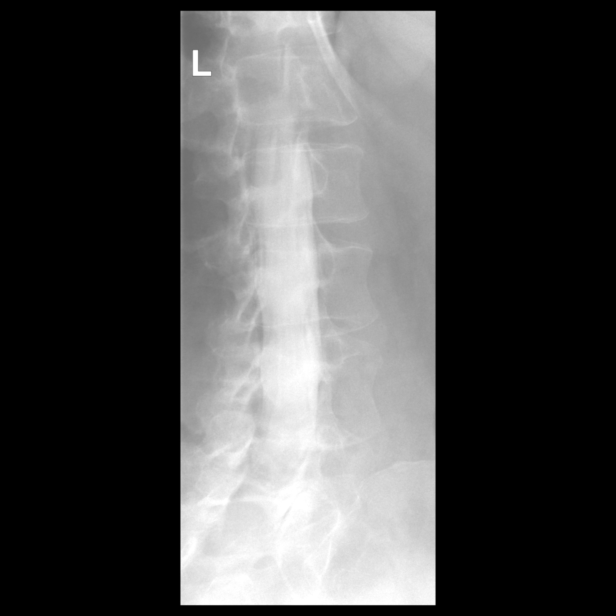

[Series 7: ortho standard · 1 of 1 slices shown (6 of 11)]
[im 1/1]
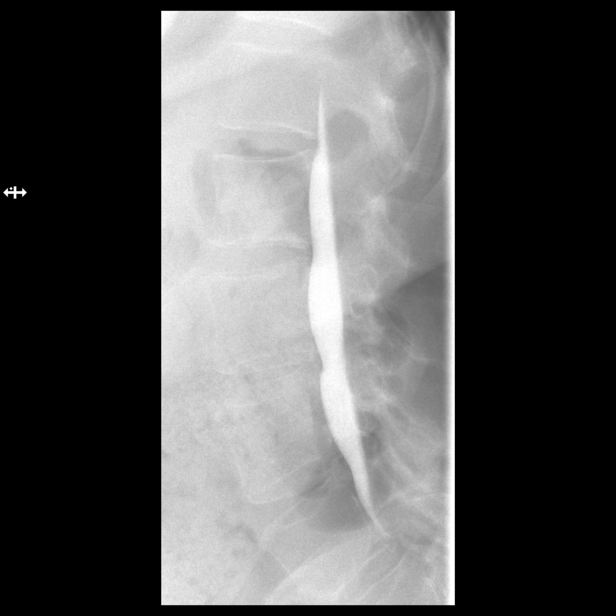

[Series 8: ortho standard · 1 of 1 slices shown (7 of 11)]
[im 1/1]
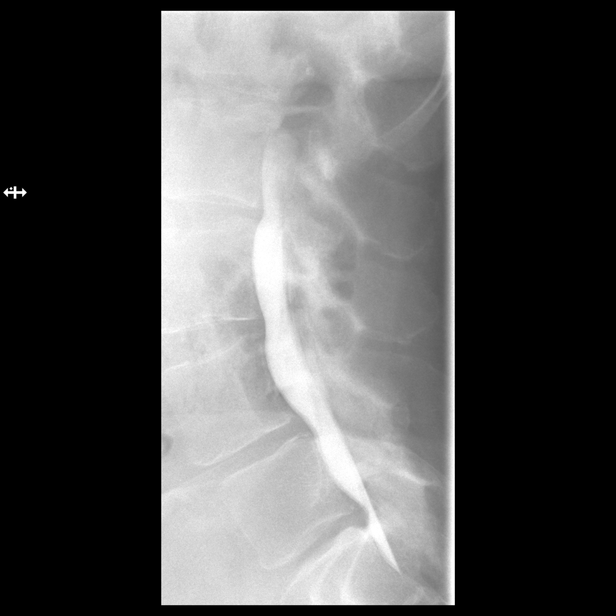

[Series 9: ortho standard · 1 of 1 slices shown (8 of 11)]
[im 1/1]
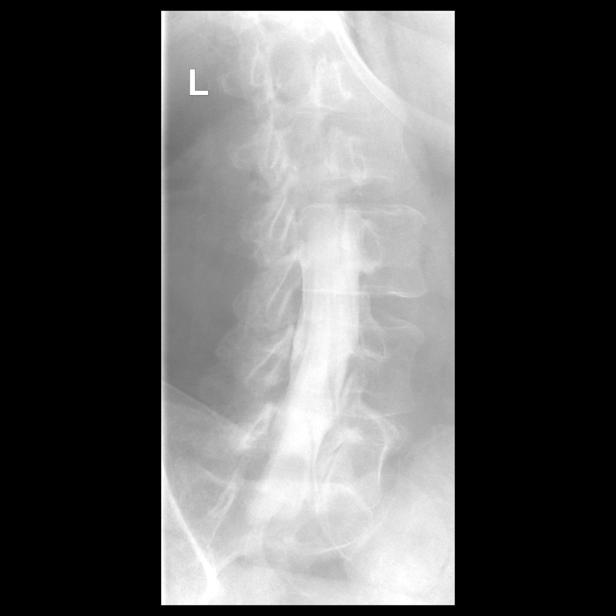

[Series 10: ortho standard · 1 of 1 slices shown (9 of 11)]
[im 1/1]
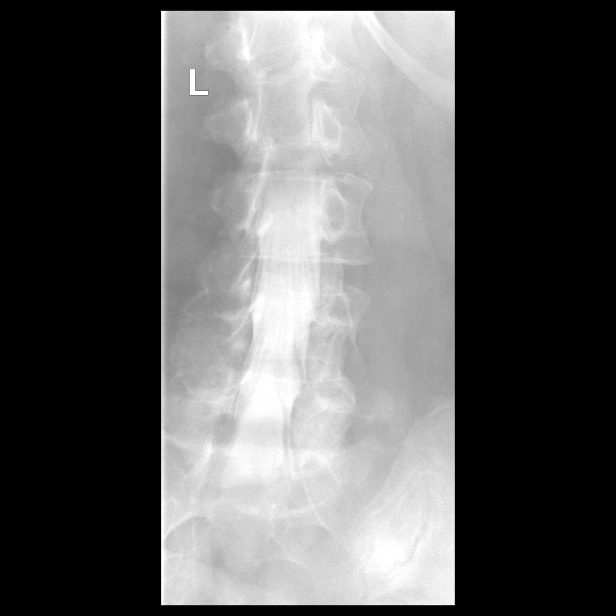

[Series 12: ortho standard · 1 of 1 slices shown (10 of 11)]
[im 1/1]
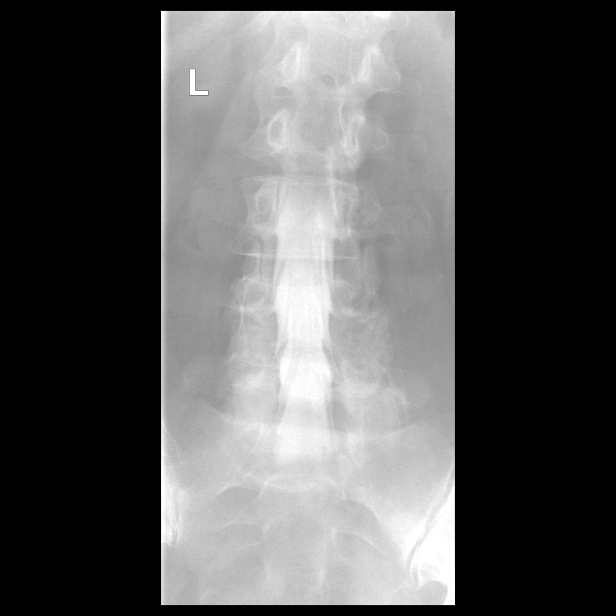

[Series 13: ortho standard · 1 of 1 slices shown (11 of 11)]
[im 1/1]
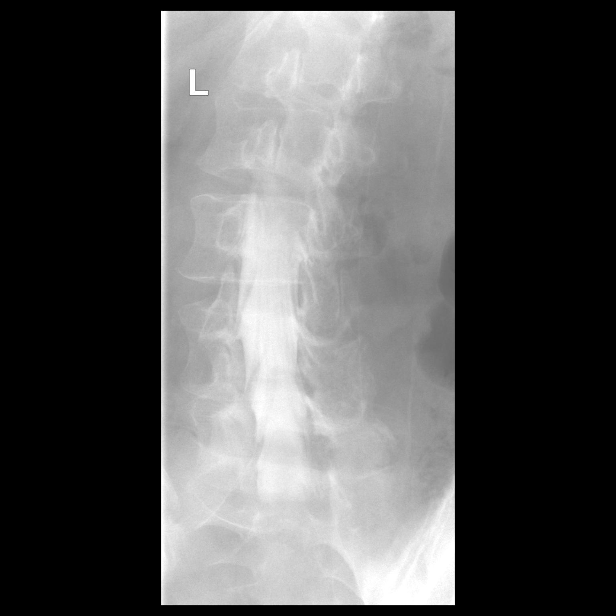

[13 of 16 positions shown; findings below may reference images not displayed]

EXAM:
LUMBAR MYELOGRAM

FLUOROSCOPY TIME:  Radiation Exposure Index (as provided by the
fluoroscopic device): 236.73 uGy*m2

Fluoroscopy Time:  15 seconds

Number of Acquired Images:  15

PROCEDURE:
After thorough discussion of risks and benefits of the procedure
including bleeding, infection, injury to nerves, blood vessels,
adjacent structures as well as headache and CSF leak, written and
oral informed consent was obtained. Consent was obtained by Dr.
Quasie Vybez. Time out form was completed.

Patient was positioned prone on the fluoroscopy table. Local
anesthesia was provided with 1% lidocaine without epinephrine after
prepped and draped in the usual sterile fashion. Puncture was
performed at L2-3 using a 3 1/2 inch 22-gauge spinal needle via
right paramedian approach. Using a single pass through the dura, the
needle was placed within the thecal sac, with return of clear CSF.
15 mL of Isovue-M 200 was injected into the thecal sac, with normal
opacification of the nerve roots and cauda equina consistent with
free flow within the subarachnoid space.

I personally performed the lumbar puncture and administered the
intrathecal contrast. I also personally supervised acquisition of
the myelogram images.
FINDINGS: LUMBAR MYELOGRAM FINDINGS:

5 lumbar type vertebral bodies are present. Slight anterolisthesis
is present at L4-5. There is uncovering of the disc. There is early
truncation of the traversing L5 nerve roots bilaterally, left
greater than right. Mild subarticular narrowing is present on the
left at L5-S1 secondary to a broad-based disc protrusion.

Minimal retrolisthesis is present at L3-4 without significant
stenosis.

Anterolisthesis at L4-5 increases slightly with standing and is
worse with flexion. The disc protrusion L5-S1 does not change
significantly with standing, flexion, or extension.

CT LUMBAR MYELOGRAM FINDINGS:

Five non rib-bearing lumbar type vertebral bodies are present. The
lumbar spine is imaged from the midbody of T11 through S2-3. Limited
imaging of the abdomen demonstrates minimal atherosclerotic
calcifications in the proximal iliac arteries. No solid organ
lesions are present. There is no significant adenopathy.

T12-L1: The a shallow disc protrusion is present. There is no
significant stenosis.

L1-2:  No significant disc protrusion or stenosis is present.

L2-3: Mild disc bulging is present. A moderate bilateral facet
hypertrophy and ligamentum flavum thickening is noted. There is no
significant stenosis.

L3-4: A rightward disc bulge is present. Moderate facet hypertrophy
and ligamentum flavum thickening is noted bilaterally. There is no
focal stenosis.

L4-5: There is uncovering of a broad-based disc protrusion. Moderate
facet hypertrophy and spurring is noted bilaterally. This results in
mild subarticular and foraminal narrowing bilaterally, right greater
than left.

L5-S1: The a broad-based disc protrusion is present. Moderate facet
hypertrophy is worse right than left. This results in mild
subarticular and foraminal narrowing bilaterally, slightly worse on
the left.
IMPRESSION: 1. Grade 1 anterolisthesis at L4-5 is exaggerated by standing and
flexion. In the supine position there is mild subarticular and
foraminal narrowing bilaterally, right greater than left. This is
not significantly changed from the MRI.
2. Broad-based disc protrusion and moderate facet hypertrophy at
L5-S1 results an mild subarticular and foraminal narrowing
bilaterally, left greater than right.
3. Disc bulge and facet hypertrophy at L2-3 and L3-4 without
significant stenosis.

## 2020-01-02 ENCOUNTER — Other Ambulatory Visit: Payer: Self-pay

## 2020-01-02 ENCOUNTER — Emergency Department (HOSPITAL_BASED_OUTPATIENT_CLINIC_OR_DEPARTMENT_OTHER)
Admission: EM | Admit: 2020-01-02 | Discharge: 2020-01-02 | Disposition: A | Discharge status: Home / Self Care | Payer: BC Managed Care – PPO | Attending: Emergency Medicine | Admitting: Emergency Medicine

## 2020-01-02 ENCOUNTER — Encounter (HOSPITAL_BASED_OUTPATIENT_CLINIC_OR_DEPARTMENT_OTHER): Payer: Self-pay | Admitting: Emergency Medicine

## 2020-01-02 DIAGNOSIS — H16131 Photokeratitis, right eye: Secondary | ICD-10-CM | POA: Diagnosis not present

## 2020-01-02 DIAGNOSIS — Z79899 Other long term (current) drug therapy: Secondary | ICD-10-CM | POA: Insufficient documentation

## 2020-01-02 DIAGNOSIS — H5712 Ocular pain, left eye: Secondary | ICD-10-CM | POA: Diagnosis present

## 2020-01-02 DIAGNOSIS — H16133 Photokeratitis, bilateral: Secondary | ICD-10-CM

## 2020-01-02 DIAGNOSIS — I10 Essential (primary) hypertension: Secondary | ICD-10-CM | POA: Diagnosis not present

## 2020-01-02 DIAGNOSIS — H16132 Photokeratitis, left eye: Secondary | ICD-10-CM | POA: Insufficient documentation

## 2020-01-02 MED ORDER — FLUORESCEIN SODIUM 1 MG OP STRP
1.0000 | ORAL_STRIP | Freq: Once | OPHTHALMIC | Status: AC
  Administered 2020-01-02: 1 via OPHTHALMIC

## 2020-01-02 MED ORDER — ERYTHROMYCIN 5 MG/GM OP OINT
TOPICAL_OINTMENT | Freq: Once | OPHTHALMIC | Status: AC
  Administered 2020-01-02: 1 via OPHTHALMIC
  Filled 2020-01-02: qty 3.5

## 2020-01-02 MED ORDER — TETRACAINE HCL 0.5 % OP SOLN
2.0000 [drp] | Freq: Once | OPHTHALMIC | Status: AC
  Administered 2020-01-02: 2 [drp] via OPHTHALMIC
  Filled 2020-01-02: qty 4

## 2020-01-02 NOTE — Discharge Instructions (Addendum)
You can use over the counter artificial tears as needed, the symptoms should resolve over the next couple of days, take tylenol and or advil for pain.  Cool compresses to the eye can also be helpful.  FOllow up with an eye doctor as we discussed

## 2020-01-02 NOTE — ED Provider Notes (Signed)
[facility] EMERGENCY DEPARTMENT Provider Note   CSN: [id number] Arrival date & time: 01/02/20  1246     History Chief Complaint  Patient presents with  . Eye Pain    Jamie Pena is a 64 y.o. female.  HPI   Patient states she has had issues with intermittent visual difficulty in her left eye.  She was supposed to see an eye doctor but had not been able to.  Patient states [place] yesterday.  It was sunny outside and she was not wearing sunglasses.  Since then she is having redness and irritation to her bilateral eyes.  She feels that it is gritty and they are irritated.  She does not recall getting anything in her eyes.  Patient also feels like her vision is blurred and the light bothers her eyes.  Past Medical History:  Diagnosis Date  . Carpal tunnel syndrome   . Hypertension   . Osteoarthritis     Patient Active Problem List   Diagnosis Date Noted  . Primary osteoarthritis of both hands 03/03/2017  . Primary osteoarthritis, left shoulder 03/03/2017  . Primary osteoarthritis of both knees 03/03/2017  . Primary osteoarthritis of both feet 03/03/2017  . Status post carpal tunnel release of both wrists 03/03/2017  . History of hypertension 01/30/2017  . History of hyperlipidemia 01/30/2017  . History of gastroesophageal reflux (GERD) 01/30/2017  . Multiple joint pain 01/27/2017  . Primary osteoarthritis of both shoulders 01/27/2017  . 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 ABDOMINAL HYSTERECTOMY    . CARPAL TUNNEL RELEASE    . TOTAL SHOULDER ARTHROPLASTY    . TRIGGER FINGER RELEASE       OB History   No obstetric history on file.     Family History  Problem Relation Age of Onset  . Osteoporosis Mother   . High arches Mother     Social History   Tobacco Use  . Smoking status: Never Smoker  . Smokeless tobacco: Never Used  Vaping  Use  . Vaping Use: Never used  Substance Use Topics  . Alcohol use: No  . Drug use: No    Home Medications Prior to Admission medications   Medication Sig Start Date End Date Taking? Authorizing Provider  amLODipine (NORVASC) 5 MG tablet Take 5 mg by mouth daily.    [provider]  gabapentin (NEURONTIN) 300 MG capsule Take 300 mg by mouth 3 (three) times daily.    [provider]  hydrochlorothiazide (HYDRODIURIL) 25 MG tablet Take 25 mg by mouth daily.    [provider]  indomethacin (INDOCIN) 50 MG capsule Take 50 mg by mouth 3 (three) times daily with meals.    [provider]  metoprolol succinate (TOPROL-XL) 25 MG 24 hr tablet  11/25/16   [provider]  omeprazole (PRILOSEC) 20 MG capsule Take 20 mg by mouth daily.    [provider]  simvastatin (ZOCOR) 10 MG tablet Take 10 mg by mouth daily.    [provider]  simvastatin (ZOCOR) 20 MG tablet  11/28/16   [provider]  traMADol Janean Sark) 50 MG tablet  01/27/17   [provider]  Vitamin D, Ergocalciferol, (DRISDOL) 50000 units CAPS capsule  01/16/17   [provider]    Allergies    Sulfa antibiotics  Review of Systems   Review of Systems  All  other systems reviewed and are negative.   Physical Exam Updated Vital Signs BP (!) 157/94 (BP Location: Left Arm)   Pulse 89   Temp 97.8 F (36.6 C) (Oral)   Resp 18   Ht 1.651 m (5\' 5" )   Wt 99.8 kg   SpO2 100%   BMI 36.61 kg/m   Physical Exam Vitals and nursing note reviewed.  Constitutional:      General: She is not in acute distress.    Appearance: She is well-developed.  HENT:     Head: Normocephalic and atraumatic.     Right Ear: External ear normal.     Left Ear: External ear normal.  Eyes:     General: No scleral icterus.       Right eye: No discharge ( ).        Left eye: No discharge.     Intraocular pressure: Right eye pressure is 19 mmHg. Left eye pressure is 19  mmHg. Measurements were taken using an automated tonometer.    Extraocular Movements: Extraocular movements intact.     Conjunctiva/sclera: Conjunctivae normal.     Pupils: Pupils are equal, round, and reactive to light.     Right eye: Fluorescein uptake present.     Left eye: Fluorescein uptake present.     Slit lamp exam:    Right eye: Anterior chamber quiet.     Left eye: Anterior chamber quiet.     Comments: Pupils are small bilaterally approximately 1 to 2 mm, small amount of mucoid drainage noted in the conjunctiva; bilateral fluorescein uptake.    Neck:     Trachea: No tracheal deviation.  Cardiovascular:     Rate and Rhythm: Normal rate.  Pulmonary:     Effort: Pulmonary effort is normal. No respiratory distress.     Breath sounds: No stridor.  Abdominal:     General: There is no distension.  Musculoskeletal:        General: No swelling or deformity.     Cervical back: Neck supple.  Skin:    General: Skin is warm and dry.     Findings: No rash.  Neurological:     Mental Status: She is alert.     Cranial Nerves: Cranial nerve deficit: no gross deficits.     ED Results / Procedures / Treatments   Labs (all labs ordered are listed, but only abnormal results are displayed) Labs Reviewed - No data to display  EKG None  Radiology No results found.  Procedures Procedures (including critical care time)  Medications Ordered in ED Medications  fluorescein ophthalmic strip 1 strip (1 strip Both Eyes Given by Other 01/02/20 1420)  tetracaine (PONTOCAINE) 0.5 % ophthalmic solution 2 drop (2 drops Both Eyes Given by Other 01/02/20 1419)  erythromycin ophthalmic ointment (1 application Both Eyes Given 01/02/20 1508)    ED Course  I have reviewed the triage vital signs and the nursing notes.  Pertinent labs & imaging results that were available during my care of the patient were reviewed by me and considered in my medical decision making (see chart for details).    MDM  Rules/Calculators/A&P                          Patient's presentation is suggestive of UV keratitis.  She had significant sun exposure and was on the lake yesterday.  Patient's pressures do not suggest acute glaucoma.  No signs of corneal injury.  Patient does have  bilateral punctate fluorescein uptake.  Patient symptoms also did improve after the topical anesthetic eyedrops.  Discussed supportive care.  Recommend outpatient follow-up with ophthalmology.   Final Clinical Impression(s) / ED Diagnoses Final diagnoses:  Photokeratitis of both eyes    Rx / DC Orders ED Discharge Orders    None       Linwood Dibbles, MD 01/02/20 1514

## 2020-01-02 NOTE — ED Triage Notes (Signed)
Patient states that she went to the lake yesterday and since she has had pain and redness to her bilateral eyes. She now has a feeling of grit in her eyes and her vision is blurred.

## 2020-05-17 ENCOUNTER — Ambulatory Visit: Payer: Self-pay | Admitting: *Deleted
